# Patient Record
Sex: Male | Born: 2005 | Race: White | Hispanic: No | Marital: Single | Smoking: Never smoker
Health system: Southern US, Community
[De-identification: ages and names within clinical notes are randomized; demographics above are authoritative.]

## PROBLEM LIST (undated history)

## (undated) DIAGNOSIS — F909 Attention-deficit hyperactivity disorder, unspecified type: Secondary | ICD-10-CM

---
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
## 2024-05-19 ENCOUNTER — Encounter: Payer: Self-pay | Admitting: Family Medicine
